# Patient Record
Sex: Female | Born: 1985 | Hispanic: Yes | Marital: Single | Smoking: Never smoker
Health system: Southern US, Community
[De-identification: ages and names within clinical notes are randomized; demographics above are authoritative.]

## PROBLEM LIST (undated history)

## (undated) DIAGNOSIS — B977 Papillomavirus as the cause of diseases classified elsewhere: Secondary | ICD-10-CM

## (undated) HISTORY — DX: Papillomavirus as the cause of diseases classified elsewhere: B97.7

---

## 2010-06-22 NOTE — L&D Delivery Note (Signed)
Delivery Note At  1:41am a viable female was delivered via LOA position. Nose and mouth were bulb suctioned. Vacuum applied at +2 station secondary to maternal exhaustion.  APGAR:8 ,9 ; weight 3295gm .   Placenta status: delivered at 1:51, whole and intact , .  Cord: 3-vessel cord. No cervical, periurethral or vaginal laceration.  Anesthesia: Epidural   Episiotomy: none Lacerations:  Second degree perineal laceration repaired with excellent hemostasis and restoration of anatomy. Suture Repair: 2.0 vicryl Est. Blood Loss (mL): 200cc  Fundus firm. Vaginal vault empty. Rectal exam within normal limits. Mom to postpartum.  Baby to nursery-stable.  Anvay Tennis 01/05/2011, 2:15 AM

## 2010-09-23 ENCOUNTER — Other Ambulatory Visit: Payer: Self-pay | Admitting: Family Medicine

## 2010-09-23 DIAGNOSIS — Z3689 Encounter for other specified antenatal screening: Secondary | ICD-10-CM

## 2010-09-23 DIAGNOSIS — O0930 Supervision of pregnancy with insufficient antenatal care, unspecified trimester: Secondary | ICD-10-CM

## 2010-09-24 ENCOUNTER — Ambulatory Visit (HOSPITAL_COMMUNITY)
Admission: RE | Admit: 2010-09-24 | Discharge: 2010-09-24 | Disposition: A | Discharge status: Home / Self Care | Payer: Medicaid Other | Source: Ambulatory Visit | Attending: Family Medicine | Admitting: Family Medicine

## 2010-09-24 DIAGNOSIS — Z1389 Encounter for screening for other disorder: Secondary | ICD-10-CM | POA: Insufficient documentation

## 2010-09-24 DIAGNOSIS — Z3689 Encounter for other specified antenatal screening: Secondary | ICD-10-CM

## 2010-09-24 DIAGNOSIS — O0930 Supervision of pregnancy with insufficient antenatal care, unspecified trimester: Secondary | ICD-10-CM | POA: Insufficient documentation

## 2010-09-24 DIAGNOSIS — O358XX0 Maternal care for other (suspected) fetal abnormality and damage, not applicable or unspecified: Secondary | ICD-10-CM | POA: Insufficient documentation

## 2010-09-24 DIAGNOSIS — Z363 Encounter for antenatal screening for malformations: Secondary | ICD-10-CM | POA: Insufficient documentation

## 2011-01-04 ENCOUNTER — Inpatient Hospital Stay (HOSPITAL_COMMUNITY): Payer: Medicaid Other | Admitting: Anesthesiology

## 2011-01-04 ENCOUNTER — Encounter (HOSPITAL_COMMUNITY): Payer: Self-pay | Admitting: Anesthesiology

## 2011-01-04 ENCOUNTER — Inpatient Hospital Stay (HOSPITAL_COMMUNITY)
Admission: AD | Admit: 2011-01-04 | Discharge: 2011-01-07 | DRG: 775 | Disposition: A | Discharge status: Home / Self Care | Payer: Medicaid Other | Source: Ambulatory Visit | Attending: Obstetrics and Gynecology | Admitting: Obstetrics and Gynecology

## 2011-01-04 ENCOUNTER — Encounter (HOSPITAL_COMMUNITY): Payer: Self-pay

## 2011-01-04 LAB — CBC
MCH: 25.9 pg — ABNORMAL LOW (ref 26.0–34.0)
MCHC: 33.5 g/dL (ref 30.0–36.0)
MCV: 77.3 fL — ABNORMAL LOW (ref 78.0–100.0)
Platelets: 287 10*3/uL (ref 150–400)
RDW: 13.5 % (ref 11.5–15.5)

## 2011-01-04 LAB — HIV ANTIBODY (ROUTINE TESTING W REFLEX): HIV: NONREACTIVE

## 2011-01-04 LAB — STREP B DNA PROBE: GBS: NEGATIVE

## 2011-01-04 LAB — ABO/RH

## 2011-01-04 LAB — RUBELLA ANTIBODY, IGM: Rubella: IMMUNE

## 2011-01-04 MED ORDER — OXYTOCIN 20 UNITS IN LACTATED RINGERS INFUSION - SIMPLE
2.0000 m[IU]/min | INTRAVENOUS | Status: DC
  Administered 2011-01-04: 2 m[IU]/min via INTRAVENOUS
  Administered 2011-01-05: 166.667 m[IU]/min via INTRAVENOUS
  Filled 2011-01-04: qty 1000

## 2011-01-04 MED ORDER — DIPHENHYDRAMINE HCL 50 MG/ML IJ SOLN: 12.5000 mg | INTRAMUSCULAR | Status: DC | PRN

## 2011-01-04 MED ORDER — EPHEDRINE 5 MG/ML INJ: 10.0000 mg | INTRAVENOUS | Status: DC | PRN

## 2011-01-04 MED ORDER — LACTATED RINGERS IV SOLN: 500.0000 mL | Freq: Once | INTRAVENOUS | Status: DC

## 2011-01-04 MED ORDER — ONDANSETRON HCL 4 MG/2ML IJ SOLN: 4.0000 mg | Freq: Four times a day (QID) | INTRAMUSCULAR | Status: DC | PRN

## 2011-01-04 MED ORDER — PHENYLEPHRINE 40 MCG/ML (10ML) SYRINGE FOR IV PUSH (FOR BLOOD PRESSURE SUPPORT)
80.0000 ug | PREFILLED_SYRINGE | INTRAVENOUS | Status: DC | PRN
Start: 2011-01-04 — End: 2011-01-05

## 2011-01-04 MED ORDER — EPHEDRINE 5 MG/ML INJ
INTRAVENOUS | Status: AC
  Administered 2011-01-04: 20 mg via INTRAVENOUS
  Filled 2011-01-04: qty 4

## 2011-01-04 MED ORDER — PHENYLEPHRINE 40 MCG/ML (10ML) SYRINGE FOR IV PUSH (FOR BLOOD PRESSURE SUPPORT): 80.0000 ug | PREFILLED_SYRINGE | INTRAVENOUS | Status: DC | PRN

## 2011-01-04 MED ORDER — TERBUTALINE SULFATE 1 MG/ML IJ SOLN: 0.2500 mg | Freq: Once | INTRAMUSCULAR | Status: AC | PRN

## 2011-01-04 MED ORDER — LIDOCAINE HCL (PF) 1 % IJ SOLN
30.0000 mL | INTRAMUSCULAR | Status: DC | PRN
  Administered 2011-01-05: 30 mL via SUBCUTANEOUS
  Filled 2011-01-04: qty 30

## 2011-01-04 MED ORDER — FENTANYL 2.5 MCG/ML BUPIVACAINE 1/10 % EPIDURAL INFUSION (WH - ANES)
14.0000 mL/h | INTRAMUSCULAR | Status: DC
  Administered 2011-01-04: 13 mL/h via EPIDURAL
  Administered 2011-01-04: 14 mL/h via EPIDURAL
  Filled 2011-01-04: qty 60

## 2011-01-04 MED ORDER — CITRIC ACID-SODIUM CITRATE 334-500 MG/5ML PO SOLN: 30.0000 mL | ORAL | Status: DC | PRN

## 2011-01-04 MED ORDER — ACETAMINOPHEN 325 MG PO TABS: 650.0000 mg | ORAL_TABLET | ORAL | Status: DC | PRN

## 2011-01-04 MED ORDER — PHENYLEPHRINE 40 MCG/ML (10ML) SYRINGE FOR IV PUSH (FOR BLOOD PRESSURE SUPPORT)
PREFILLED_SYRINGE | INTRAVENOUS | Status: AC
  Filled 2011-01-04: qty 5

## 2011-01-04 MED ORDER — NALBUPHINE SYRINGE 5 MG/0.5 ML
10.0000 mg | INJECTION | INTRAMUSCULAR | Status: DC | PRN
  Administered 2011-01-04: 10 mg via INTRAVENOUS
  Filled 2011-01-04: qty 1

## 2011-01-04 MED ORDER — LACTATED RINGERS IV SOLN: 500.0000 mL | INTRAVENOUS | Status: DC | PRN

## 2011-01-04 MED ORDER — LACTATED RINGERS IV SOLN
INTRAVENOUS | Status: DC
  Administered 2011-01-04 (×3): via INTRAVENOUS

## 2011-01-04 MED ORDER — EPHEDRINE 5 MG/ML INJ
10.0000 mg | INTRAVENOUS | Status: DC | PRN
Start: 2011-01-04 — End: 2011-01-05
  Administered 2011-01-04: 20 mg via INTRAVENOUS

## 2011-01-04 MED ORDER — FENTANYL 2.5 MCG/ML BUPIVACAINE 1/10 % EPIDURAL INFUSION (WH - ANES)
INTRAMUSCULAR | Status: AC
  Administered 2011-01-04: 14 mL/h via EPIDURAL
  Filled 2011-01-04: qty 60

## 2011-01-04 MED ORDER — LIDOCAINE HCL 1.5 % IJ SOLN
INTRAMUSCULAR | Status: DC | PRN
  Administered 2011-01-04: 4 mL via INTRADERMAL
  Administered 2011-01-04: 4 mL

## 2011-01-04 NOTE — H&P (Signed)
Delmar Constance Goltz is a 25 y.o. female presenting for SROM and contractions. Maternal Medical History:  Reason for admission: Reason for admission: rupture of membranes.  Contractions: Onset was 1-2 hours ago.   Frequency: irregular.   Perceived severity is moderate.    Fetal activity: Perceived fetal activity is normal.   Last perceived fetal movement was within the past hour.    Prenatal complications: No bleeding, cholelithiasis, HIV, hypertension, infection, IUGR, nephrolithiasis, oligohydramnios, placental abnormality, polyhydramnios, pre-eclampsia, preterm labor, substance abuse, thrombocytopenia or thrombophilia.     OB History    Grav Para Term Preterm Abortions TAB SAB Ect Mult Living   1              No past medical history on file. No past surgical history on file. Family History: family history is not on file. Social History:  does not have a smoking history on file. She does not have any smokeless tobacco history on file. Her alcohol and drug histories not on file.  ROS    Blood pressure 123/80, pulse 76, temperature 98.5 F (36.9 C), temperature source Oral, resp. rate 16, height 5\' 1"  (1.549 m), weight 71.124 kg (156 lb 12.8 oz). Exam Physical Exam  Prenatal labs: ABO, Rh:   Antibody:   Rubella:   RPR:    HBsAg:    HIV:    GBS:     Assessment/Plan: Admit and antisipate vag delivery.  Zerita Boers 01/04/2011, 9:37 AM

## 2011-01-04 NOTE — Anesthesia Preprocedure Evaluation (Deleted)
Anesthesia Evaluation  Name, MR# and DOB Patient awake  General Assessment Comment  Reviewed: Allergy & Precautions, H&P  and Patient's Chart, lab work & pertinent test results  Airway Mallampati: III TM Distance: >3 FB Neck ROM: full    Dental No notable dental hx (+) Teeth Intact   Pulmonaryneg pulmonary ROS    clear to auscultation  pulmonary exam normal   Cardiovascular regular Normal   Neuro/PsychNegative Neurological ROS Negative Psych ROS  GI/Hepatic/Renal negative GI ROS, negative Liver ROS, and negative Renal ROS (+)       Endo/Other  Negative Endocrine ROS (+)   Abdominal   Musculoskeletal negative musculoskeletal ROS (+)  Hematology negative hematology ROS (+)   Peds  Reproductive/Obstetrics (+) Pregnancy   Anesthesia Other Findings             Anesthesia Physical Anesthesia Plan  ASA: II  Anesthesia Plan: Epidural   Post-op Pain Management:    Induction:   Airway Management Planned:   Additional Equipment:   Intra-op Plan:   Post-operative Plan:   Informed Consent: I have reviewed the patients History and Physical, chart, labs and discussed the procedure including the risks, benefits and alternatives for the proposed anesthesia with the patient or authorized representative who has indicated his/her understanding and acceptance.     Plan Discussed with: Anesthesiologist  Anesthesia Plan Comments:         Anesthesia Quick Evaluation

## 2011-01-04 NOTE — Progress Notes (Signed)
Patient had gush of fluid about 40 minutes ago thick and pink, no contractions

## 2011-01-04 NOTE — Progress Notes (Signed)
  Maria Black is a 25 y.o. G1P0 at [redacted]w[redacted]d by ultrasound admitted for rupture of membranes  Subjective: Pt doing well. Pain controled with epidural. Pt not feeling urge to push or pressure.   Objective: BP 121/66  Pulse 78  Temp(Src) 97.5 F (36.4 C) (Oral)  Resp 20  Ht 5\' 1"  (1.549 m)  Wt 156 lb 12.8 oz (71.124 kg)  BMI 29.63 kg/m2  SpO2 97% I/O last 3 completed shifts: In: -  Out: 500 [Urine:500]    FHT:  FHR: 150 bpm, variability: moderate,  accelerations:  Present,  decelerations:  Present variable X1 UC:   regular, every 2 minutes SVE:   Dilation: 10 Effacement (%): 100 Station: +2 Exam by:: Dr. Gwenlyn Saran & A. White RN  Labs: Lab Results  Component Value Date   WBC 7.6 01/04/2011   HGB 11.4* 01/04/2011   HCT 34.0* 01/04/2011   MCV 77.3* 01/04/2011   PLT 287 01/04/2011    Assessment / Plan: Augmentation of labor, progressing well. Pt on pitocin  Labor: progressing well. Will let patient labor down since pt not feeling urge to push.  Preeclampsia:  NA Fetal Wellbeing:  Category I Pain Control:  Epidural I/D:  n/a Anticipated MOD:  NSVD  Maria Black 01/04/2011, 8:21 PM

## 2011-01-04 NOTE — Anesthesia Procedure Notes (Signed)
Epidural Patient location during procedure: OB Start time: 01/04/2011 3:50 PM  Staffing Anesthesiologist: Curtis Uriarte A. Performed by: anesthesiologist   Preanesthetic Checklist Completed: patient identified, site marked, surgical consent, pre-op evaluation, timeout performed, IV checked, risks and benefits discussed and monitors and equipment checked  Epidural Patient position: sitting Prep: site prepped and draped and DuraPrep Patient monitoring: continuous pulse ox and blood pressure Approach: midline Injection technique: LOR air  Needle Needle type: Tuohy  Needle gauge: 17 G Needle length: 9 cm Catheter type: closed end flexible Catheter size: 19 Gauge Test dose: negative and 1.5% lidocaine  Assessment Events: blood not aspirated, injection not painful, no injection resistance, negative IV test and no paresthesia  Additional Notes Pt. Tolerated procedure well. Refer to RN notes for VS & FHR

## 2011-01-04 NOTE — Progress Notes (Signed)
Per D. Hart Rochester CNM orders received to stop epidural for second stage. Anesthesia notified. Epidural off.  Onalee Hua RN

## 2011-01-04 NOTE — Progress Notes (Signed)
Maria Black is a 25 y.o. G1P0 at [redacted]w[redacted]d by ultrasound admitted for active labor, rupture of membranes  Subjective:   Objective: BP 98/59  Pulse 85  Temp(Src) 98 F (36.7 C) (Oral)  Resp 20  Ht 5\' 1"  (1.549 m)  Wt 71.124 kg (156 lb 12.8 oz)  BMI 29.63 kg/m2      FHT:  FHR: 125-130 bpm, variability: moderate,  accelerations:  Present,  decelerations:  Absent UC:   regular, every 3-5 minutes SVE:   Dilation: 5 Effacement (%): 90 Station: 0 Exam by:: K.FORSELL,RNC  Labs: Lab Results  Component Value Date   WBC 7.6 01/04/2011   HGB 11.4* 01/04/2011   HCT 34.0* 01/04/2011   MCV 77.3* 01/04/2011   PLT 287 01/04/2011    Assessment / Plan: Spontaneous labor, progressing normally  Labor: Progressing normally Preeclampsia:  no signs or symptoms of toxicity Fetal Wellbeing:  Category I Pain Control:  nubain I/D:  n/a Anticipated MOD:  NSVD  Maria Black 01/04/2011, 2:35 PM

## 2011-01-04 NOTE — Anesthesia Preprocedure Evaluation (Addendum)
Anesthesia Evaluation  Name, MR# and DOB Patient awake  General Assessment Comment  Reviewed: Allergy & Precautions, H&P  and Patient's Chart, lab work & pertinent test results  Airway Mallampati: III TM Distance: >3 FB Neck ROM: full    Dental No notable dental hx    Pulmonaryneg pulmonary ROS    clear to auscultation  pulmonary exam normal   Cardiovascular regular Normal   Neuro/Psych  GI/Hepatic/Renal negative GI ROS, negative Liver ROS, and negative Renal ROS (+)       Endo/Other  Negative Endocrine ROS (+)   Abdominal   Musculoskeletal  Hematology negative hematology ROS (+)   Peds  Reproductive/Obstetrics (+) Pregnancy   Anesthesia Other Findings             Anesthesia Physical Anesthesia Plan  ASA: II  Anesthesia Plan: Epidural   Post-op Pain Management:    Induction:   Airway Management Planned:   Additional Equipment:   Intra-op Plan:   Post-operative Plan:   Informed Consent: I have reviewed the patients History and Physical, chart, labs and discussed the procedure including the risks, benefits and alternatives for the proposed anesthesia with the patient or authorized representative who has indicated his/her understanding and acceptance.     Plan Discussed with: Anesthesiologist  Anesthesia Plan Comments:         Anesthesia Quick Evaluation

## 2011-01-04 NOTE — Progress Notes (Signed)
  Inadequate contraction strength. FHR pattern reassuring. Will start pit aug.

## 2011-01-05 ENCOUNTER — Encounter (HOSPITAL_COMMUNITY): Payer: Self-pay | Admitting: *Deleted

## 2011-01-05 MED ORDER — OXYTOCIN 20 UNITS IN LACTATED RINGERS INFUSION - SIMPLE: 125.0000 mL/h | INTRAVENOUS | Status: DC | PRN

## 2011-01-05 MED ORDER — PRENATAL PLUS 27-1 MG PO TABS
1.0000 | ORAL_TABLET | Freq: Every day | ORAL | Status: DC
  Administered 2011-01-05 – 2011-01-07 (×3): 1 via ORAL
  Filled 2011-01-05 (×3): qty 1

## 2011-01-05 MED ORDER — BENZOCAINE-MENTHOL 20-0.5 % EX AERO
INHALATION_SPRAY | CUTANEOUS | Status: AC
  Filled 2011-01-05: qty 56

## 2011-01-05 MED ORDER — OXYCODONE-ACETAMINOPHEN 5-325 MG PO TABS
1.0000 | ORAL_TABLET | ORAL | Status: DC | PRN
  Administered 2011-01-07: 2 via ORAL
  Filled 2011-01-05 (×2): qty 1

## 2011-01-05 MED ORDER — IBUPROFEN 600 MG PO TABS
600.0000 mg | ORAL_TABLET | Freq: Four times a day (QID) | ORAL | Status: DC
  Administered 2011-01-05 – 2011-01-07 (×9): 600 mg via ORAL
  Filled 2011-01-05 (×9): qty 1

## 2011-01-05 MED ORDER — ONDANSETRON HCL 4 MG/2ML IJ SOLN: 4.0000 mg | INTRAMUSCULAR | Status: DC | PRN

## 2011-01-05 MED ORDER — ONDANSETRON HCL 4 MG PO TABS: 4.0000 mg | ORAL_TABLET | ORAL | Status: DC | PRN

## 2011-01-05 MED ORDER — SENNOSIDES-DOCUSATE SODIUM 8.6-50 MG PO TABS
1.0000 | ORAL_TABLET | Freq: Every day | ORAL | Status: DC
  Administered 2011-01-05: 2 via ORAL

## 2011-01-05 MED ORDER — OXYTOCIN 20 UNITS IN LACTATED RINGERS INFUSION - SIMPLE
125.0000 mL/h | INTRAVENOUS | Status: DC | PRN
  Administered 2011-01-05: 500 mL/h via INTRAVENOUS

## 2011-01-05 MED ORDER — BENZOCAINE-MENTHOL 20-0.5 % EX AERO: 1.0000 "application " | INHALATION_SPRAY | CUTANEOUS | Status: DC | PRN

## 2011-01-05 MED ORDER — TETANUS-DIPHTH-ACELL PERTUSSIS 5-2.5-18.5 LF-MCG/0.5 IM SUSP
0.5000 mL | Freq: Once | INTRAMUSCULAR | Status: AC
  Administered 2011-01-06: 0.5 mL via INTRAMUSCULAR
  Filled 2011-01-05: qty 0.5

## 2011-01-05 MED ORDER — ZOLPIDEM TARTRATE 5 MG PO TABS: 5.0000 mg | ORAL_TABLET | Freq: Every evening | ORAL | Status: DC | PRN

## 2011-01-05 MED ORDER — WITCH HAZEL-GLYCERIN EX PADS: MEDICATED_PAD | CUTANEOUS | Status: DC | PRN

## 2011-01-05 NOTE — Progress Notes (Signed)
01/05/2011 Ayme Short    Interpreter  I assisted patient with her breakfast order.  

## 2011-01-05 NOTE — Progress Notes (Signed)
UR chart review completed.  

## 2011-01-05 NOTE — Progress Notes (Signed)
Interpretor was notified for communication with patient. me

## 2011-01-05 NOTE — Progress Notes (Addendum)
Basic teaching reviewed with mom with interpreter  Albertina Senegal. Reviewed lactation brochure with mom. Advised of outpatient services if needed.

## 2011-01-05 NOTE — Progress Notes (Addendum)
Marly Adams Interpreter assisted Rodrigo Ran, RN with patient education. Agnes Lawrence 01/05/2011 1800

## 2011-01-06 LAB — CBC
HCT: 27.8 % — ABNORMAL LOW (ref 36.0–46.0)
Hemoglobin: 9.4 g/dL — ABNORMAL LOW (ref 12.0–15.0)
MCHC: 33.8 g/dL (ref 30.0–36.0)
MCV: 78.3 fL (ref 78.0–100.0)
RDW: 14 % (ref 11.5–15.5)

## 2011-01-06 NOTE — Addendum Note (Signed)
Addendum  created 01/06/11 0757 by Tyrone Apple. Maria Black   Modules edited:Notes Section

## 2011-01-06 NOTE — Plan of Care (Signed)
Problem: Discharge Progression Outcomes Goal: Barriers To Progression Addressed/Resolved Outcome: Progressing Pt. Speaks spanish, using interpreter and family as needed

## 2011-01-06 NOTE — Progress Notes (Signed)
01/06/2011 Dodi Leu  Interpreter  I assisted [facility] with questions

## 2011-01-06 NOTE — Addendum Note (Signed)
Addendum  created 01/06/11 0758 by Tyrone Apple. Maria Black   Modules edited:Notes Section

## 2011-01-06 NOTE — Anesthesia Postprocedure Evaluation (Signed)
  Anesthesia Post-op Note  Patient: Maria Black  Procedure(s) Performed: * No procedures listed *  Patient Location: PACU and Labor and Delivery  Anesthesia Type: Epidural  Level of Consciousness: awake, alert  and oriented  Airway and Oxygen Therapy: Patient Spontanous Breathing  Post-op Pain: none  Post-op Assessment: Post-op Vital signs reviewed and Patient's Cardiovascular Status Stable  Post-op Vital Signs: Reviewed and stable  Complications: No apparent anesthesia complications

## 2011-01-06 NOTE — Progress Notes (Signed)
  Post Partum Day 1 Subjective: up ad lib, voiding, tolerating PO and denies any abdominal pain  Objective: Blood pressure 90/56, pulse 62, temperature 97.4 F (36.3 C), temperature source Oral, resp. rate 18, height 5\' 1"  (1.549 m), weight 156 lb 12.8 oz (71.124 kg), SpO2 97.00%, unknown if currently breastfeeding.  Physical Exam:  General: alert and cooperative Lochia: appropriate Uterine Fundus: firm, at umbilicus DVT Evaluation: No evidence of DVT seen on physical exam. Negative Homan's sign.   Basename 01/06/11 0530 01/04/11 0947  HGB 9.4* 11.4*  HCT 27.8* 34.0*    Assessment/Plan: Plan for discharge tomorrow, Breastfeeding, Lactation consult and Contraception undecided   LOS: 2 days   Scot Shiraishi 01/06/2011, 7:55 AM

## 2011-01-07 MED ORDER — DOCUSATE SODIUM 100 MG PO CAPS: 100.0000 mg | ORAL_CAPSULE | Freq: Every day | ORAL | Status: AC | PRN

## 2011-01-07 MED ORDER — IBUPROFEN 600 MG PO TABS: 600.0000 mg | ORAL_TABLET | Freq: Four times a day (QID) | ORAL | Status: AC

## 2011-01-07 NOTE — Progress Notes (Signed)
Post Partum Day 2 Subjective: no complaints; breastfeeding going well; undecided re contraception  Objective: Blood pressure 105/68, pulse 65, temperature 97.9 F (36.6 C), temperature source Oral, resp. rate 18, height 5\' 1"  (1.549 m), weight 71.124 kg (156 lb 12.8 oz), SpO2 97.00%, unknown if currently breastfeeding.  Physical Exam:  General: alert Lochia: appropriate Uterine Fundus: firm DVT Evaluation: No evidence of DVT seen on physical exam.   Basename 01/06/11 0530 01/04/11 0947  HGB 9.4* 11.4*  HCT 27.8* 34.0*    Assessment/Plan: Discharge home Follow up at [facility] in 6wks or prn   LOS: 3 days   Rayquan Amrhein B 01/07/2011, 8:19 AM

## 2011-01-07 NOTE — Progress Notes (Signed)
Dad translated for me. Baby is sleepy at some feedings. Reviewed awakening techniques with parents. Baby was tightly swaddled- unwrapped and shirt taken off- baby nursing well. No questions at present.

## 2011-01-07 NOTE — Discharge Summary (Signed)
Obstetric Discharge Summary Reason for Admission: onset of labor Prenatal Procedures: none Intrapartum Procedures: vacuum Postpartum Procedures: none Complications-Operative and Postpartum: 2nd degree perineal laceration  Hemoglobin  Date Value Range Status  01/06/2011 9.4* 12.0-15.0 (g/dL) Final     DELTA CHECK NOTED     REPEATED TO VERIFY     HCT  Date Value Range Status  01/06/2011 27.8* 36.0-46.0 (%) Final    Discharge Diagnoses: Term Pregnancy-delivered  Discharge Information: Date: 01/07/2011 Activity: pelvic rest Diet: routine Medications: PNV, Ibuprophen and Colace Condition: stable  Instructions: routine PP instructions Discharge to: home Follow-up Information    Follow up with [facility] [place]. Make an appointment in 6 weeks.   Contact information:   [address] [facility] [place] [address]          Newborn Data: Live born  Information for the patient's newborn:  Judiann, Celia [[id number]]  female ; APGAR , ; weight ;  Home with mother.  FRAZIER,NATALIE 01/07/2011, 11:40 AM

## 2011-01-07 NOTE — Progress Notes (Signed)
Provided discharge teaching via pacific interpreter [phone]. Patient voiced understanding via interpreter.

## 2011-01-07 NOTE — Discharge Instructions (Signed)
Cuidados luego de un parto por vía vaginal °(Postpartum Care After Vaginal Delivery) °Luego del nacimiento del bebé deberá permanecer en el hospital durante 24 a 72 horas, excepto que hubiera existido algún problema, o usted sufra alguna enfermedad. Mientras se encuentre en el hospital recibirá ayuda e instrucciones por parte de las enfermeras y el médico, quienes cuidarán de usted y su bebé y le darán consejos para amamantarlo correctamente, especialmente si es el primer hijo.  °En caso de ser necesario, le prescribirán analgésicos. Observará una pequeña hemorragia vaginal y deberá cambiar los apósitos con frecuencia. Lávese las manos cuidadosamente con agua y jabón durante al menos 20 segundos luego de cambiarse el apósito o ir al baño. Si elimina coágulos o aumenta la hemorragia, infórmelo a la enfermera. No deseche los coágulos sanguíneos antes de mostrárselos a la enfermera, para asegurarse de que no es tejido placentario. °Si le han colocado una vía intravenosa, se la retirarán dentro de las 24 horas, si no hay problemas. La primera vez que se levante de la cama o tome una ducha, llame a la enfermera para que la ayude que puede sentirse débil, mareada o desmayarse. Si está amamantando, puede sentir contracciones dolorosas en el útero durante algunas semanas. Esto es normal y necesario, ya que de este modo el útero vuelve a su tamaño normal. Si no está amamantando, use un sostén apretado y disminuya la ingesta de líquidos. Podrán indicarle un medicamento para eliminar la leche de las mamas. No deben administrarse hormonas para suprimir la leche, debido a que pueden causar coágulos sanguíneos. Podrá seguir una dieta normal, excepto que sufra diabetes o presente otros problemas de salud.  °La enfermera colocará bolsas con hielo en el sitio de la episiotomía (agrandamiento quirúrgico de la apertura vaginal) para reducir el dolor y la hinchazón. En algunos casos raros hay dificultad para orinar, entonces la  enfermera deberá vaciarle la vejiga con un catéter. Si le han practicado una ligadura tubaria durante el posparto ("trompas atadas", esterilización femenina), esto no hará que permanezca más tiempo en el hospital. °Podrá tener al bebé en su habitación todo el tiempo que lo desee si el bebé no tiene ningún problema. Lleve y traiga al bebé de la nursery dentro de la cunita. No lo lleve en brazos. No abandone el área de posparto. Si la madre es Rh negativa (falta de una proteína en los glóbulos rojos) y el bebé es Rh positivo, la madre debe aplicarse la vacuna RhoGam para evitar problemas con el factor Rh en futuros embarazos °Le darán instrucciones por escrito para usted y el bebé y los medicamentos necesarios cuando reciba el alta médica. Asegúrese que comprende y sigue las indicaciones. °INSTRUCCIONES PARA EL CUIDADO DOMICILIARIO DESPUÉS DEL PROCEDIMIENTO °· Siga las instrucciones y tome los medicamentos que le indicaron cuando le dieron el alta médica.  °· Utilice los medicamentos de venta libre o de prescripción para el dolor, el malestar o la fiebre, según se lo indique el profesional que lo asiste.  °· No tome aspirina, ya que puede causar hemorragias.  °· Aumente sus actividades un poco cada día para tener más fuerza y resistencia.  °· No beba alcohol, especialmente si está amamantando o toma analgésicos.  °· Tómese la temperatura dos veces por día y regístrela.  °· Podrá tener una pequeña hemorragia durante 2 a 4 semanas. Esto es normal.  °· No utilice tampones o duchas vaginales, use toallas higiénicas.  °· Trate de que alguna persona permanezca con usted y la ayude durante los primeros   días en el hogar.  °· Descanse o duerma una siesta cuando el bebé duerma.  °· Si está amamantando, use un buen sostén. Si no está amamantando, use un sostén apretado, no estimule los pezones y disminuya la ingesta de líquidos.  °· Consuma una dieta sana y siga tomando las vitaminas prenatales.  °· No conduzca vehículos, no  realice actividades pesadas ni viaje hasta que su médico la autorice.  °· No mantenga relaciones sexuales hasta que el médico lo permita.  °· Consulte con el profesional cuando puede comenzar a realizar actividad física y que tipo de ejercicios puede hacer.  °· Comuníquese inmediatamente con el médico si tiene problemas luego del parto.  °· Comuníquese con el pediatra si tiene problemas con el bebé.  °· Programe su visita de control luego del parto y cúmplala.  °SOLICITE ATENCIÓN MÉDICA SI: °· La temperatura se eleva por encima de 100° F (37,8° C).  °· Aumenta la hemorragia vaginal o elimina coágulos. Conserve algunos coágulos para mostrárselos al médico.  °· Observa sangre o siente dolor al orinar.  °· Presenta secreción vaginal con olor fétido.  °· Aumenta el dolor o la inflamación en el sitio de la episiotomía (agrandamiento quirúrgico de la apertura vaginal).  °· Sufre una cefalea grave.  °· Se siente deprimida.  °· La incisión se abre.  °· Se siente mareada o sufre un desmayo.  °· Aparece una erupción cutánea.  °· Tiene una reacción o problemas con su medicamento.  °· Siente dolor u observa enrojecimiento e hinchazón en el sitio de la vía intravenosa.  °SOLICITE ATENCIÓN MÉDICA DE INMEDIATO SI: °· Siente dolor en el pecho.  °· Comienza a sentir falta de aire.  °· Se desmaya.  °· Siente dolor, con o sin hinchazón e irritación en la pierna.  °· Tiene una hemorragia vaginal abundante, con o sin coágulos  °· Siente dolor en el estómago.  °· Observa una secreción vaginal con mal olor.  °ASEGURESE QUE:  °· Comprende estas instrucciones.  °· Controlará su enfermedad.  °· Solicitará ayuda de inmediato si no mejora o empeora.  °Document Released: 04/05/2007 Document Re-Released: 09/02/2009 °ExitCare® Patient Information ©2011 [employer]. °

## 2014-04-23 ENCOUNTER — Encounter (HOSPITAL_COMMUNITY): Payer: Self-pay | Admitting: *Deleted

## 2014-06-22 NOTE — L&D Delivery Note (Cosign Needed)
Delivery Note Pt pushed well and at 11:54 PM a viable female was delivered via Vaginal, Spontaneous Delivery (Presentation: Left Occiput Anterior).  APGAR: 9, 9; weight : pending .  Infant dried and lifted to pt's abd. Cord clamped and cut by FOB. Hospital cord blood sample collected. Placenta status: Intact, Spontaneous.  Cord: 3 vessels   Anesthesia: Local  Episiotomy: None Lacerations: 2nd degree;Perineal Suture Repair: 3.0 vicryl Est. Blood Loss (mL): 168  Mom to postpartum.  Baby to Couplet care / Skin to Skin.  SHAW, KIMBERLY CNM 03/01/2015, 1:10 AM

## 2014-11-22 ENCOUNTER — Other Ambulatory Visit (HOSPITAL_COMMUNITY): Payer: Self-pay | Admitting: Physician Assistant

## 2014-11-22 DIAGNOSIS — Z3689 Encounter for other specified antenatal screening: Secondary | ICD-10-CM

## 2014-11-22 LAB — OB RESULTS CONSOLE ANTIBODY SCREEN: Antibody Screen: NEGATIVE

## 2014-11-22 LAB — OB RESULTS CONSOLE GC/CHLAMYDIA
CHLAMYDIA, DNA PROBE: NEGATIVE
Gonorrhea: NEGATIVE

## 2014-11-22 LAB — OB RESULTS CONSOLE HEPATITIS B SURFACE ANTIGEN: HEP B S AG: NEGATIVE

## 2014-11-22 LAB — OB RESULTS CONSOLE ABO/RH: RH Type: POSITIVE

## 2014-11-22 LAB — OB RESULTS CONSOLE HIV ANTIBODY (ROUTINE TESTING): HIV: NONREACTIVE

## 2014-11-22 LAB — OB RESULTS CONSOLE RPR: RPR: NONREACTIVE

## 2014-11-22 LAB — OB RESULTS CONSOLE RUBELLA ANTIBODY, IGM: Rubella: IMMUNE

## 2014-11-28 ENCOUNTER — Ambulatory Visit (HOSPITAL_COMMUNITY)
Admission: RE | Admit: 2014-11-28 | Discharge: 2014-11-28 | Disposition: A | Discharge status: Home / Self Care | Payer: Self-pay | Source: Ambulatory Visit | Attending: Physician Assistant | Admitting: Physician Assistant

## 2014-11-28 DIAGNOSIS — Z3689 Encounter for other specified antenatal screening: Secondary | ICD-10-CM

## 2014-11-28 DIAGNOSIS — Z3A25 25 weeks gestation of pregnancy: Secondary | ICD-10-CM | POA: Insufficient documentation

## 2014-11-28 DIAGNOSIS — Z36 Encounter for antenatal screening of mother: Secondary | ICD-10-CM | POA: Insufficient documentation

## 2014-11-30 DIAGNOSIS — Z3689 Encounter for other specified antenatal screening: Secondary | ICD-10-CM | POA: Insufficient documentation

## 2014-11-30 DIAGNOSIS — Z3A25 25 weeks gestation of pregnancy: Secondary | ICD-10-CM | POA: Insufficient documentation

## 2014-12-13 ENCOUNTER — Other Ambulatory Visit (HOSPITAL_COMMUNITY): Payer: Self-pay | Admitting: Physician Assistant

## 2014-12-13 DIAGNOSIS — Z0489 Encounter for examination and observation for other specified reasons: Secondary | ICD-10-CM

## 2014-12-13 DIAGNOSIS — IMO0002 Reserved for concepts with insufficient information to code with codable children: Secondary | ICD-10-CM

## 2014-12-13 DIAGNOSIS — Z3A29 29 weeks gestation of pregnancy: Secondary | ICD-10-CM

## 2014-12-26 ENCOUNTER — Ambulatory Visit (HOSPITAL_COMMUNITY)
Admission: RE | Admit: 2014-12-26 | Discharge: 2014-12-26 | Disposition: A | Discharge status: Home / Self Care | Payer: Self-pay | Source: Ambulatory Visit | Attending: Physician Assistant | Admitting: Physician Assistant

## 2014-12-26 DIAGNOSIS — Z36 Encounter for antenatal screening of mother: Secondary | ICD-10-CM | POA: Insufficient documentation

## 2014-12-26 DIAGNOSIS — Z0489 Encounter for examination and observation for other specified reasons: Secondary | ICD-10-CM

## 2014-12-26 DIAGNOSIS — Z3A29 29 weeks gestation of pregnancy: Secondary | ICD-10-CM | POA: Insufficient documentation

## 2014-12-26 DIAGNOSIS — IMO0002 Reserved for concepts with insufficient information to code with codable children: Secondary | ICD-10-CM

## 2015-02-21 LAB — OB RESULTS CONSOLE GBS: GBS: NEGATIVE

## 2015-02-28 ENCOUNTER — Encounter (HOSPITAL_COMMUNITY): Payer: Self-pay

## 2015-02-28 ENCOUNTER — Inpatient Hospital Stay (HOSPITAL_COMMUNITY)
Admission: AD | Admit: 2015-02-28 | Discharge: 2015-03-02 | DRG: 775 | Disposition: A | Discharge status: Home / Self Care | Payer: Medicaid Other | Source: Ambulatory Visit | Attending: Obstetrics & Gynecology | Admitting: Obstetrics & Gynecology

## 2015-02-28 DIAGNOSIS — Z3A38 38 weeks gestation of pregnancy: Secondary | ICD-10-CM | POA: Diagnosis present

## 2015-02-28 DIAGNOSIS — IMO0001 Reserved for inherently not codable concepts without codable children: Secondary | ICD-10-CM

## 2015-02-28 LAB — CBC
HEMATOCRIT: 35.5 % — AB (ref 36.0–46.0)
Hemoglobin: 11.9 g/dL — ABNORMAL LOW (ref 12.0–15.0)
MCH: 26.3 pg (ref 26.0–34.0)
MCHC: 33.5 g/dL (ref 30.0–36.0)
MCV: 78.4 fL (ref 78.0–100.0)
Platelets: 265 10*3/uL (ref 150–400)
RBC: 4.53 MIL/uL (ref 3.87–5.11)
RDW: 14.8 % (ref 11.5–15.5)
WBC: 8.8 10*3/uL (ref 4.0–10.5)

## 2015-02-28 MED ORDER — OXYCODONE-ACETAMINOPHEN 5-325 MG PO TABS: 1.0000 | ORAL_TABLET | ORAL | Status: DC | PRN

## 2015-02-28 MED ORDER — OXYTOCIN 40 UNITS IN LACTATED RINGERS INFUSION - SIMPLE MED
62.5000 mL/h | INTRAVENOUS | Status: DC
  Administered 2015-03-01: 62.5 mL/h via INTRAVENOUS
  Filled 2015-02-28: qty 1000

## 2015-02-28 MED ORDER — CITRIC ACID-SODIUM CITRATE 334-500 MG/5ML PO SOLN: 30.0000 mL | ORAL | Status: DC | PRN

## 2015-02-28 MED ORDER — ACETAMINOPHEN 325 MG PO TABS: 650.0000 mg | ORAL_TABLET | ORAL | Status: DC | PRN

## 2015-02-28 MED ORDER — OXYCODONE-ACETAMINOPHEN 5-325 MG PO TABS: 2.0000 | ORAL_TABLET | ORAL | Status: DC | PRN

## 2015-02-28 MED ORDER — FENTANYL CITRATE (PF) 100 MCG/2ML IJ SOLN
100.0000 ug | INTRAMUSCULAR | Status: DC | PRN
  Administered 2015-02-28: 100 ug via INTRAVENOUS
  Filled 2015-02-28: qty 2

## 2015-02-28 MED ORDER — ONDANSETRON HCL 4 MG/2ML IJ SOLN: 4.0000 mg | Freq: Four times a day (QID) | INTRAMUSCULAR | Status: DC | PRN

## 2015-02-28 MED ORDER — LIDOCAINE HCL (PF) 1 % IJ SOLN
30.0000 mL | INTRAMUSCULAR | Status: DC | PRN
  Administered 2015-03-01: 30 mL via SUBCUTANEOUS
  Filled 2015-02-28: qty 30

## 2015-02-28 MED ORDER — LACTATED RINGERS IV SOLN: 500.0000 mL | INTRAVENOUS | Status: DC | PRN

## 2015-02-28 MED ORDER — OXYTOCIN BOLUS FROM INFUSION: 500.0000 mL | INTRAVENOUS | Status: DC

## 2015-02-28 NOTE — MAU Note (Signed)
Pt has been contracting since this morning at 1000. Pt reports a small amount of bleeding one hour ago after she went to the bathroom and wiped.

## 2015-02-28 NOTE — Progress Notes (Signed)
Patient admitted to room 165 via stretcher from MAU.

## 2015-03-01 ENCOUNTER — Encounter (HOSPITAL_COMMUNITY): Payer: Self-pay

## 2015-03-01 LAB — RPR: RPR: NONREACTIVE

## 2015-03-01 LAB — ABO/RH: ABO/RH(D): A POS

## 2015-03-01 LAB — TYPE AND SCREEN
ABO/RH(D): A POS
Antibody Screen: NEGATIVE

## 2015-03-01 MED ORDER — DIPHENHYDRAMINE HCL 25 MG PO CAPS: 25.0000 mg | ORAL_CAPSULE | Freq: Four times a day (QID) | ORAL | Status: DC | PRN

## 2015-03-01 MED ORDER — TETANUS-DIPHTH-ACELL PERTUSSIS 5-2.5-18.5 LF-MCG/0.5 IM SUSP: 0.5000 mL | Freq: Once | INTRAMUSCULAR | Status: DC

## 2015-03-01 MED ORDER — PRENATAL MULTIVITAMIN CH
1.0000 | ORAL_TABLET | Freq: Every day | ORAL | Status: DC
  Administered 2015-03-01 – 2015-03-02 (×2): 1 via ORAL
  Filled 2015-03-01 (×2): qty 1

## 2015-03-01 MED ORDER — ONDANSETRON HCL 4 MG/2ML IJ SOLN: 4.0000 mg | INTRAMUSCULAR | Status: DC | PRN

## 2015-03-01 MED ORDER — OXYCODONE-ACETAMINOPHEN 5-325 MG PO TABS: 1.0000 | ORAL_TABLET | ORAL | Status: DC | PRN

## 2015-03-01 MED ORDER — ACETAMINOPHEN 325 MG PO TABS: 650.0000 mg | ORAL_TABLET | ORAL | Status: DC | PRN

## 2015-03-01 MED ORDER — LACTATED RINGERS IV SOLN: INTRAVENOUS | Status: DC

## 2015-03-01 MED ORDER — SIMETHICONE 80 MG PO CHEW: 80.0000 mg | CHEWABLE_TABLET | ORAL | Status: DC | PRN

## 2015-03-01 MED ORDER — INFLUENZA VAC SPLIT QUAD 0.5 ML IM SUSY
0.5000 mL | PREFILLED_SYRINGE | INTRAMUSCULAR | Status: AC
  Administered 2015-03-02: 0.5 mL via INTRAMUSCULAR
  Filled 2015-03-01: qty 0.5

## 2015-03-01 MED ORDER — IBUPROFEN 600 MG PO TABS
600.0000 mg | ORAL_TABLET | Freq: Four times a day (QID) | ORAL | Status: DC
  Administered 2015-03-01 – 2015-03-02 (×6): 600 mg via ORAL
  Filled 2015-03-01 (×6): qty 1

## 2015-03-01 MED ORDER — ZOLPIDEM TARTRATE 5 MG PO TABS: 5.0000 mg | ORAL_TABLET | Freq: Every evening | ORAL | Status: DC | PRN

## 2015-03-01 MED ORDER — ONDANSETRON HCL 4 MG PO TABS: 4.0000 mg | ORAL_TABLET | ORAL | Status: DC | PRN

## 2015-03-01 MED ORDER — BENZOCAINE-MENTHOL 20-0.5 % EX AERO: 1.0000 "application " | INHALATION_SPRAY | CUTANEOUS | Status: DC | PRN

## 2015-03-01 MED ORDER — OXYCODONE-ACETAMINOPHEN 5-325 MG PO TABS: 2.0000 | ORAL_TABLET | ORAL | Status: DC | PRN

## 2015-03-01 MED ORDER — SENNOSIDES-DOCUSATE SODIUM 8.6-50 MG PO TABS
2.0000 | ORAL_TABLET | ORAL | Status: DC
  Administered 2015-03-02: 2 via ORAL
  Filled 2015-03-01: qty 2

## 2015-03-01 MED ORDER — LANOLIN HYDROUS EX OINT: TOPICAL_OINTMENT | CUTANEOUS | Status: DC | PRN

## 2015-03-01 MED ORDER — DIBUCAINE 1 % RE OINT: 1.0000 "application " | TOPICAL_OINTMENT | RECTAL | Status: DC | PRN

## 2015-03-01 MED ORDER — WITCH HAZEL-GLYCERIN EX PADS: 1.0000 "application " | MEDICATED_PAD | CUTANEOUS | Status: DC | PRN

## 2015-03-01 NOTE — Lactation Note (Signed)
This note was copied from the chart of Maria Black. Lactation Consultation Note  Patient Name: Maria Black [id number] Date: 03/01/2015 Reason for consult: Initial assessment Mom is experienced BF and reports this baby is nursing well. Mom denies questions/concerns. Encouraged to continue to BF with feeding ques. Lactation brochure left for review, advised of OP services and support group. Mom to call if she would like assist. Viria the in-house Spanish interpreter present for visit.   Maternal Data Formula Feeding for Exclusion: No Has patient been taught Hand Expression?: Yes Does the patient have breastfeeding experience prior to this delivery?: Yes  Feeding Feeding Type: Breast Fed Length of feed: 20 min  LATCH Score/Interventions                      Lactation Tools Discussed/Used WIC Program: Yes   Consult Status Consult Status: PRN    Alfred Levins 03/01/2015, 5:06 PM

## 2015-03-01 NOTE — H&P (Signed)
Maria Black is a 29 y.o. female G2P1001 @ 38.3wks by 25wk scan presenting for reg ctx. Denies leaking or bldg. Reports +FM. Denies H/A, N/V/D or fever. Her preg has been followed by the [facility] and has been remarkable for 1) GBS neg 2) lang barrier- Spanish 3) elevated 1hr glucola w/ nl 3hr GTT. Initially w/ cx change from 3 to 5cm in MAU.  History OB History    Gravida Para Term Preterm AB TAB SAB Ectopic Multiple Living   0 2     History reviewed. No pertinent past medical history. History reviewed. No pertinent past surgical history. Family History: family history is not on file. Social History:  reports that she has never smoked. She has never used smokeless tobacco. She reports that she does not drink alcohol or use illicit drugs.   Prenatal Transfer Tool  Maternal Diabetes: No Genetic Screening: Declined- too late Maternal Ultrasounds/Referrals: Normal Fetal Ultrasounds or other Referrals:  None Maternal Substance Abuse:  No Significant Maternal Medications:  None Significant Maternal Lab Results:  Lab values include: Group B Strep negative Other Comments:  None  ROS  Dilation: 10 Effacement (%): 100 Station: +2 Exam by:: Cammy Copa, RN Blood pressure 111/68, pulse 96, temperature 98.9 F (37.2 C), temperature source Oral, resp. rate 18, weight 69.854 kg (154 lb), last menstrual period 06/04/2014, unknown if currently breastfeeding. Exam Physical Exam  Constitutional: She is oriented to person, place, and time. She appears well-developed.  HENT:  Head: Normocephalic.  Neck: Normal range of motion.  Cardiovascular: Normal rate.   Respiratory: Effort normal.  GI:  EFM 120-130s, +accels, no decels Reg ctx q 3-4 mins  Genitourinary: Vagina normal.  Musculoskeletal: Normal range of motion.  Neurological: She is alert and oriented to person, place, and time.  Skin: Skin is warm and dry.  Psychiatric: She has a normal mood and affect. Her  behavior is normal. Thought content normal.    Prenatal labs: ABO, Rh: --/--/A POS (09/08 2250) Antibody: NEG (09/08 2250) Rubella: Immune (06/02 0000) RPR: Nonreactive (06/02 0000)  HBsAg: Negative (06/02 0000)  HIV: Non-reactive (06/02 0000)  GBS: Negative (09/01 0000)   Assessment/Plan: IUP@term  Early active labor GBS neg  Admit to [facility] Expectant management Anticipate SVD   SHAW, KIMBERLY CNM 03/01/2015, 1:03 AM

## 2015-03-01 NOTE — Progress Notes (Addendum)
Post Partum Day 1 Subjective: no complaints, up ad lib, voiding and tolerating PO  Objective: Blood pressure 108/66, pulse 74, temperature 98.6 F (37 C), temperature source Oral, resp. rate 18, height 5' (1.524 m), weight 154 lb (69.854 kg), last menstrual period 06/04/2014, unknown if currently breastfeeding.  Physical Exam:  General: alert, cooperative and no distress  Heart rate RRR, no murmur Lungs clear bilaterally Lochia: appropriate Uterine Fundus: firm Incision: healing well DVT Evaluation: No evidence of DVT seen on physical exam.   Recent Labs  02/28/15 2250  HGB 11.9*  HCT 35.5*    Assessment/Plan: Plan for discharge tomorrow and Breastfeeding   LOS: 1 day   [facility] 03/01/2015, 9:34 AM

## 2015-03-01 NOTE — Progress Notes (Signed)
I assisted RN Shanda Bumps with some questions, I ordered her meals, also I assisted Olegario Messier from [employer], by Orlan Leavens Spanish Interpreter.

## 2015-03-02 DIAGNOSIS — Z3A38 38 weeks gestation of pregnancy: Secondary | ICD-10-CM

## 2015-03-02 MED ORDER — IBUPROFEN 600 MG PO TABS: 600.0000 mg | ORAL_TABLET | Freq: Four times a day (QID) | ORAL | Status: DC

## 2015-03-02 NOTE — Lactation Note (Signed)
This note was copied from the chart of Maria Shayonna Gillihan. Lactation Consultation Note  Follow up with parents prior to d/c with hospital interpreter present. Mom reports that infant is eating well and was given a bottle of formula earlier to be able to get baby's heart screen. Mom reports she does not feel fullness yet and is without pain to breast or nipples. Encouraged feeding 8-12 times/ 24 hours and recording intake and output. Has Ped. appt Monday for recheck, told to take I/O sheet with them. Discussed Engorgement management and Hand pump given with instructions on use, set up and cleaning. Mom voiced she knows how to hand express. Referred to Breastfeeding information in Taking Care of Baby and Me handout. Mom is aware of LC outpatient services, Breastfeeding resources, support groups, and has [facility] handout with phone #. Mom encouraged to call for questions or concerns. Mom voiced no questions at this time.  Patient Name: Maria Black [id number] Date: 03/02/2015     Maternal Data    Feeding    LATCH Score/Interventions                      Lactation Tools Discussed/Used     Consult Status      Ed Blalock 03/02/2015, 12:28 PM

## 2015-03-02 NOTE — Discharge Instructions (Signed)

## 2015-03-02 NOTE — Discharge Summary (Signed)
Obstetric Discharge Summary Reason for Admission: onset of labor Prenatal Procedures: NST Intrapartum Procedures: spontaneous vaginal delivery Postpartum Procedures: none Complications-Operative and Postpartum: 2 degree perineal laceration HEMOGLOBIN  Date Value Ref Range Status  02/28/2015 11.9* 12.0 - 15.0 g/dL Final   HCT  Date Value Ref Range Status  02/28/2015 35.5* 36.0 - 46.0 % Final   Delivery Note Pt pushed well and at 11:54 PM a viable female was delivered via Vaginal, Spontaneous Delivery (Presentation: Left Occiput Anterior).  APGAR: 9, 9; weight : pending .  Infant dried and lifted to pt's abd. Cord clamped and cut by FOB. Hospital cord blood sample collected. Placenta status: Intact, Spontaneous.  Cord: 3 vessels   Anesthesia: Local  Episiotomy: None Lacerations: 2nd degree;Perineal Suture Repair: 3.0 vicryl Est. Blood Loss (mL): 168  Patient had an uncomplicated postpartum course and was tolerating eating/ambulating. She was tolerating her diet. She was deemed appropriate for discharge.   Physical Exam:  General: alert, cooperative and appears stated age 29: appropriate Uterine Fundus: firm DVT Evaluation: No evidence of DVT seen on physical exam.  Discharge Diagnoses: Term Pregnancy-delivered  Discharge Information: Date: 03/02/2015 Activity: pelvic rest Diet: routine Medications: PNV and Ibuprofen Condition: stable Instructions: refer to practice specific booklet Discharge to: home   Newborn Data: Live born female  Birth Weight: 7 lb 7.4 oz (3385 g) APGAR: 9, 9  Home with mother.  Isa Rankin [facility] 03/02/2015, 1:34 PM

## 2015-03-03 NOTE — Progress Notes (Signed)
Assisted RN with interpretation of discharge instructions.  °Spanish Interpreter  °

## 2018-04-28 DIAGNOSIS — R1032 Left lower quadrant pain: Secondary | ICD-10-CM | POA: Insufficient documentation

## 2018-05-11 DIAGNOSIS — K760 Fatty (change of) liver, not elsewhere classified: Secondary | ICD-10-CM | POA: Insufficient documentation

## 2018-05-11 DIAGNOSIS — I499 Cardiac arrhythmia, unspecified: Secondary | ICD-10-CM | POA: Insufficient documentation

## 2018-05-16 DIAGNOSIS — I491 Atrial premature depolarization: Secondary | ICD-10-CM | POA: Insufficient documentation

## 2018-05-16 DIAGNOSIS — I493 Ventricular premature depolarization: Secondary | ICD-10-CM | POA: Insufficient documentation

## 2018-06-08 NOTE — Progress Notes (Deleted)
    Cardiology Office Note   Date:  06/08/2018   ID:  Maria Black, DOB [birth year], MRN [id number]  PCP:  No primary care provider on file.  Cardiologist:   No primary care provider on file. Referring:  ***  No chief complaint on file.     History of Present Illness: Maria Black is a 32 y.o. female who is referred by *** for evaluation of ***  No past medical history on file.  No past surgical history on file.   Current Outpatient Medications  Medication Sig Dispense Refill  . ibuprofen (ADVIL,MOTRIN) 600 MG tablet Take 1 tablet (600 mg total) by mouth every 6 (six) hours. 90 tablet 3  . Prenatal Vit-Fe Fumarate-FA (PRENATAL MULTIVITAMIN) 60-1 MG tablet Take 1 tablet by mouth daily.      No current facility-administered medications for this visit.     Allergies:   Patient has no known allergies.    Social History:  The patient  reports that she has never smoked. She has never used smokeless tobacco. She reports that she does not drink alcohol or use drugs.   Family History:  The patient's ***family history is not on file.    ROS:  Please see the history of present illness.   Otherwise, review of systems are positive for {NONE DEFAULTED:18576::"none"}.   All other systems are reviewed and negative.    PHYSICAL EXAM: VS:  There were no vitals taken for this visit. , BMI There is no height or weight on file to calculate BMI. GENERAL:  Well appearing HEENT:  Pupils equal round and reactive, fundi not visualized, oral mucosa unremarkable NECK:  No jugular venous distention, waveform within normal limits, carotid upstroke brisk and symmetric, no bruits, no thyromegaly LYMPHATICS:  No cervical, inguinal adenopathy LUNGS:  Clear to auscultation bilaterally BACK:  No CVA tenderness CHEST:  Unremarkable HEART:  PMI not displaced or sustained,S1 and S2 within normal limits, no S3, no S4, no clicks, no rubs, *** murmurs ABD:  Flat, positive bowel  sounds normal in frequency in pitch, no bruits, no rebound, no guarding, no midline pulsatile mass, no hepatomegaly, no splenomegaly EXT:  2 plus pulses throughout, no edema, no cyanosis no clubbing SKIN:  No rashes no nodules NEURO:  Cranial nerves II through XII grossly intact, motor grossly intact throughout PSYCH:  Cognitively intact, oriented to person place and time    EKG:  EKG {ACTION; IS/IS WUJ:[id number]}OT:[id number]} ordered today. The ekg ordered today demonstrates ***   Recent Labs: No results found for requested labs within last 8760 hours.    Lipid Panel No results found for: CHOL, TRIG, HDL, CHOLHDL, VLDL, LDLCALC, LDLDIRECT    Wt Readings from Last 3 Encounters:  02/28/15 154 lb (69.9 kg)  01/04/11 156 lb 12.8 oz (71.1 kg)      Other studies Reviewed: Additional studies/ records that were reviewed today include: ***. Review of the above records demonstrates:  Please see elsewhere in the note.  ***   ASSESSMENT AND PLAN:  ***   Current medicines are reviewed at length with the patient today.  The patient {ACTIONS; HAS/DOES NOT HAVE:19233} concerns regarding medicines.  The following changes have been made:  {PLAN; NO CHANGE:13088:s}  Labs/ tests ordered today include: *** No orders of the defined types were placed in this encounter.    Disposition:   FU with ***    Signed, Rollene RotundaJames Cordarius Benning, MD  06/08/2018 8:21 PM    [facility]

## 2018-06-10 ENCOUNTER — Ambulatory Visit: Payer: Self-pay | Admitting: Cardiology

## 2018-06-17 ENCOUNTER — Encounter: Payer: Self-pay | Admitting: *Deleted

## 2018-08-05 DIAGNOSIS — R142 Eructation: Secondary | ICD-10-CM | POA: Insufficient documentation

## 2018-10-17 DIAGNOSIS — K219 Gastro-esophageal reflux disease without esophagitis: Secondary | ICD-10-CM | POA: Insufficient documentation

## 2019-03-01 ENCOUNTER — Other Ambulatory Visit: Payer: Self-pay

## 2019-03-02 ENCOUNTER — Encounter: Payer: Self-pay | Admitting: Gynecology

## 2019-03-02 ENCOUNTER — Ambulatory Visit (INDEPENDENT_AMBULATORY_CARE_PROVIDER_SITE_OTHER): Payer: Self-pay | Admitting: Gynecology

## 2019-03-02 VITALS — BP 120/70 | Ht 63.0 in | Wt 132.0 lb

## 2019-03-02 DIAGNOSIS — N86 Erosion and ectropion of cervix uteri: Secondary | ICD-10-CM

## 2019-03-02 DIAGNOSIS — Z113 Encounter for screening for infections with a predominantly sexual mode of transmission: Secondary | ICD-10-CM

## 2019-03-02 NOTE — Patient Instructions (Signed)
Consider the Mirena IUD as we discussed.  Follow-up routinely with your primary physician for ongoing health care

## 2019-03-02 NOTE — Addendum Note (Signed)
Addended by: Thurnell Garbe A on: 03/02/2019 10:45 AM   Modules accepted: Orders

## 2019-03-02 NOTE — Progress Notes (Signed)
    Maria Black [birth year] [id number]        32 y.o.  [id number] new patient referred for questionable cervical lesion.  Was examined yesterday and was referred stat for questionable lesion.  Pap smear was reportedly done with results pending.  Was treated for a yeast infection yesterday with Diflucan.  Has regular monthly menses.  Currently not using contraception and wants to talk about contraception.  Past medical history,surgical history, problem list, medications, allergies, family history and social history were all reviewed and documented in the EPIC chart.  Directed ROS with pertinent positives and negatives documented in the history of present illness/assessment and plan.  Exam: Copywriter, advertising with  provided interpreter Vitals:   03/02/19 0931  BP: 120/70  Weight: 132 lb (59.9 kg)  Height: 5\' 3"  (1.6 m)   General appearance:  Normal Abdomen soft nontender without masses guarding rebound Pelvic external BUS vagina normal.  Cervix normal.  Uterus normal size midline mobile nontender.  Adnexa without masses or tenderness.  Colposcopy performed after acetic acid cleanse was adequate and normal noting transformation zone visualized 360 degrees.  Patient has normal-appearing ectropion.  Assessment/Plan:  33 y.o. [id number] with normal-appearing cervix grossly and under colposcopic inspection.  I think what was seen was a normal ectropion extending more prominently at 12:00 then elsewhere.  Patient asked to make sure she follows up for Pap smear results and as long as it is normal then no further evaluation needed.  We discussed contraceptive options to include pill, Depo-Provera which she has used in the past, Nexplanon and IUDs.  I discussed Mirena IUD to include insertion and benefits such as menstrual suppression, efficacy and long-term nature.  The patient is interested in pursuing the Mirena IUD.  She will make arrangements to have this done at her other doctor's  office as it is more convenient for her.  She understands she is at risk for pregnancy not using contraception.  She will follow-up with her other physician for routine ongoing health care.    Anastasio Auerbach MD, 10:25 AM 03/02/2019

## 2019-03-04 LAB — C. TRACHOMATIS/N. GONORRHOEAE RNA
C. trachomatis RNA, TMA: NOT DETECTED
N. gonorrhoeae RNA, TMA: NOT DETECTED

## 2019-03-13 ENCOUNTER — Encounter: Payer: Self-pay | Admitting: Gynecology

## 2020-04-11 ENCOUNTER — Encounter: Payer: Self-pay | Admitting: Nurse Practitioner

## 2020-04-29 ENCOUNTER — Ambulatory Visit: Payer: Self-pay | Admitting: Nurse Practitioner
